# Patient Record
Sex: Female | Born: 1955 | Hispanic: No | Smoking: Never smoker
Health system: Southern US, Community
[De-identification: ages and names within clinical notes are randomized; demographics above are authoritative.]

## PROBLEM LIST (undated history)

## (undated) DIAGNOSIS — I10 Essential (primary) hypertension: Secondary | ICD-10-CM

## (undated) HISTORY — PX: ABDOMINAL HYSTERECTOMY: SHX81

---

## 2019-06-27 ENCOUNTER — Encounter: Payer: Self-pay | Admitting: Emergency Medicine

## 2019-06-27 ENCOUNTER — Other Ambulatory Visit: Payer: Self-pay

## 2019-06-27 ENCOUNTER — Emergency Department: Payer: Self-pay

## 2019-06-27 DIAGNOSIS — I10 Essential (primary) hypertension: Secondary | ICD-10-CM | POA: Insufficient documentation

## 2019-06-27 DIAGNOSIS — R002 Palpitations: Secondary | ICD-10-CM | POA: Insufficient documentation

## 2019-06-27 DIAGNOSIS — R1011 Right upper quadrant pain: Secondary | ICD-10-CM | POA: Insufficient documentation

## 2019-06-27 LAB — CBC WITH DIFFERENTIAL/PLATELET
Abs Immature Granulocytes: 0.01 10*3/uL (ref 0.00–0.07)
Basophils Absolute: 0 10*3/uL (ref 0.0–0.1)
Basophils Relative: 1 %
Eosinophils Absolute: 0.2 10*3/uL (ref 0.0–0.5)
Eosinophils Relative: 3 %
HCT: 35.6 % — ABNORMAL LOW (ref 36.0–46.0)
Hemoglobin: 11.7 g/dL — ABNORMAL LOW (ref 12.0–15.0)
Immature Granulocytes: 0 %
Lymphocytes Relative: 34 %
Lymphs Abs: 1.5 10*3/uL (ref 0.7–4.0)
MCH: 27.6 pg (ref 26.0–34.0)
MCHC: 32.9 g/dL (ref 30.0–36.0)
MCV: 84 fL (ref 80.0–100.0)
Monocytes Absolute: 0.3 10*3/uL (ref 0.1–1.0)
Monocytes Relative: 7 %
Neutro Abs: 2.4 10*3/uL (ref 1.7–7.7)
Neutrophils Relative %: 55 %
Platelets: 195 10*3/uL (ref 150–400)
RBC: 4.24 MIL/uL (ref 3.87–5.11)
RDW: 12.8 % (ref 11.5–15.5)
WBC: 4.4 10*3/uL (ref 4.0–10.5)
nRBC: 0 % (ref 0.0–0.2)

## 2019-06-27 LAB — COMPREHENSIVE METABOLIC PANEL
ALT: 11 U/L (ref 0–44)
AST: 13 U/L — ABNORMAL LOW (ref 15–41)
Albumin: 3.8 g/dL (ref 3.5–5.0)
Alkaline Phosphatase: 67 U/L (ref 38–126)
Anion gap: 7 (ref 5–15)
BUN: 22 mg/dL (ref 8–23)
CO2: 23 mmol/L (ref 22–32)
Calcium: 8.8 mg/dL — ABNORMAL LOW (ref 8.9–10.3)
Chloride: 111 mmol/L (ref 98–111)
Creatinine, Ser: 1.05 mg/dL — ABNORMAL HIGH (ref 0.44–1.00)
GFR calc Af Amer: >60 mL/min (ref >60)
GFR calc non Af Amer: 57 mL/min — ABNORMAL LOW (ref >60)
Glucose, Bld: 119 mg/dL — ABNORMAL HIGH (ref 70–99)
Potassium: 3.8 mmol/L (ref 3.5–5.1)
Sodium: 141 mmol/L (ref 135–145)
Total Bilirubin: 0.5 mg/dL (ref 0.3–1.2)
Total Protein: 7.1 g/dL (ref 6.5–8.1)

## 2019-06-27 LAB — TROPONIN I (HIGH SENSITIVITY): Troponin I (High Sensitivity): 7 ng/L (ref <18)

## 2019-06-27 NOTE — ED Triage Notes (Signed)
Pt to triage via w/c with no distress noted, mask in place; Family reports pt c/o "heart beating rapidly", SHOB since yesterday; no c/o at present, symptoms have been intermittent; denies hx of same

## 2019-06-28 ENCOUNTER — Emergency Department: Payer: Self-pay

## 2019-06-28 ENCOUNTER — Encounter: Payer: Self-pay | Admitting: Radiology

## 2019-06-28 ENCOUNTER — Emergency Department
Admission: EM | Admit: 2019-06-28 | Discharge: 2019-06-28 | Disposition: A | Discharge status: Home / Self Care | Payer: Self-pay | Attending: Emergency Medicine | Admitting: Emergency Medicine

## 2019-06-28 DIAGNOSIS — R1011 Right upper quadrant pain: Secondary | ICD-10-CM

## 2019-06-28 DIAGNOSIS — R002 Palpitations: Secondary | ICD-10-CM

## 2019-06-28 HISTORY — DX: Essential (primary) hypertension: I10

## 2019-06-28 LAB — LIPASE, BLOOD: Lipase: 31 U/L (ref 11–51)

## 2019-06-28 LAB — TROPONIN I (HIGH SENSITIVITY): Troponin I (High Sensitivity): 9 ng/L (ref <18)

## 2019-06-28 MED ORDER — IOHEXOL 350 MG/ML SOLN
100.0000 mL | Freq: Once | INTRAVENOUS | Status: AC | PRN
  Administered 2019-06-28: 100 mL via INTRAVENOUS

## 2019-06-28 MED ORDER — IOHEXOL 350 MG/ML SOLN: 75.0000 mL | Freq: Once | INTRAVENOUS | Status: DC | PRN

## 2019-07-02 NOTE — ED Provider Notes (Signed)
[facility] Emergency Department Provider Note  ____________________________________________   First MD Initiated Contact with Patient 06/28/19 0158     (approximate)  I have reviewed the triage vital signs and the nursing notes.   HISTORY  Chief Complaint Shortness of Breath    HPI Deborah Wiley is a 64 y.o. female with history of hypertension presents to the emergency department secondary to intermittent rapid heartbeat which patient states has been occurring only in the evening between the hours of 4 and 7 PM.  Patient also admits to associated dyspnea and chest pain with a current pain score 7 out of 10.  Patient denies any nausea or vomiting.  Patient denies any abdominal pain.  Patient denies any diarrhea constipation.  Patient denies any fever or cough.  Patient denies any dizziness.  Patient denies any feelings of rapid heartbeat at present or discomfort.  Of note on arrival patient stated sharp chest pain that was 7 out of 10.     Past Medical History:  Diagnosis Date  . Hypertension     There are no problems to display for this patient.   Past Surgical History:  Procedure Laterality Date  . ABDOMINAL HYSTERECTOMY      Prior to Admission medications   Medication Sig Start Date End Date Taking? Authorizing Provider  valsartan-hydrochlorothiazide (DIOVAN-HCT) 80-12.5 MG tablet Take 1 tablet by mouth daily.   Yes [provider]    Allergies Patient has no known allergies.  No family history on file.  Social History Social History   Tobacco Use  . Smoking status: Never Smoker  . Smokeless tobacco: Never Used  Substance Use Topics  . Alcohol use: Not on file  . Drug use: Not on file    Review of Systems Constitutional: No fever/chills Eyes: No visual changes. ENT: No sore throat. Cardiovascular: Positive for chest pain and rapid heartbeat. Respiratory: Positive for shortness of breath. Gastrointestinal: No  abdominal pain.  No nausea, no vomiting.  No diarrhea.  No constipation. Genitourinary: Negative for dysuria. Musculoskeletal: Negative for neck pain.  Negative for back pain. Integumentary: Negative for rash. Neurological: Negative for headaches, focal weakness or numbness.  ____________________________________________   PHYSICAL EXAM:  VITAL SIGNS: ED Triage Vitals  Enc Vitals Group     BP 06/27/19 2209 (!) 151/78     Pulse Rate 06/27/19 2209 (!) 116     Resp 06/27/19 2209 18     Temp 06/27/19 2209 98.5 F (36.9 C)     Temp Source 06/27/19 2209 Oral     SpO2 06/27/19 2209 97 %     Weight 06/27/19 2209 68 kg (150 lb)     Height 06/27/19 2209 1.575 m (5\' 2" )     Head Circumference --      Peak Flow --      Pain Score 06/27/19 2216 0     Pain Loc --      Pain Edu? --      Excl. in GC? --     Constitutional: Alert and oriented.  Eyes: Conjunctivae are normal.  Mouth/Throat: Patient is wearing a mask. Neck: No stridor.  No meningeal signs.   Cardiovascular: Normal rate, regular rhythm. Good peripheral circulation. Grossly normal heart sounds. Respiratory: Normal respiratory effort.  No retractions. Gastrointestinal: Right upper quadrant/epigastric tenderness to palpation.. No distention.  Musculoskeletal: No lower extremity tenderness nor edema. No gross deformities of extremities. Neurologic:  Normal speech and language. No gross focal neurologic deficits are appreciated.  Skin:  Skin is warm, dry and intact. Psychiatric: Mood and affect are normal. Speech and behavior are normal.  ____________________________________________   LABS (all labs ordered are listed, but only abnormal results are displayed)  Labs Reviewed  CBC WITH DIFFERENTIAL/PLATELET - Abnormal; Notable for the following components:      Result Value   Hemoglobin 11.7 (*)    HCT 35.6 (*)    All other components within normal limits  COMPREHENSIVE METABOLIC PANEL - Abnormal; Notable for the following  components:   Glucose, Bld 119 (*)    Creatinine, Ser 1.05 (*)    Calcium 8.8 (*)    AST 13 (*)    GFR calc non Af Amer 57 (*)    All other components within normal limits  LIPASE, BLOOD  TROPONIN I (HIGH SENSITIVITY)  TROPONIN I (HIGH SENSITIVITY)   ____________________________________________  EKG  ED ECG REPORT I, Hun[place]ell N BROWN, the attending physician, personally viewed and interpreted this ECG.   Date: 06/27/2019  EKG Time: 10:14 PM  Rate: 117  Rhythm: Sinus tachycardia  Axis: Normal  Intervals: Normal  ST&T Change: None _________________________________  RADIOLOGY I,  N BROWN, personally viewed and evaluated these images (plain radiographs) as part of my medical decision making, as well as reviewing the written report by the radiologist.    Official radiology report(s): CLINICAL DATA:  64 year old female with shortness of breath and tachycardia. Acute abdominal pain.  EXAM: CT ANGIOGRAPHY CHEST  CT ABDOMEN AND PELVIS WITH CONTRAST  TECHNIQUE: Multidetector CT imaging of the chest was performed using the standard protocol during bolus administration of intravenous contrast. Multiplanar CT image reconstructions and MIPs were obtained to evaluate the vascular anatomy. Multidetector CT imaging of the abdomen and pelvis was performed using the standard protocol during bolus administration of intravenous contrast.  CONTRAST:  OMNIPAQUE IOHEXOL 350 MG/ML SOLN  COMPARISON:  Chest radiographs last night.  FINDINGS: CTA CHEST FINDINGS  Cardiovascular: Good contrast bolus timing in the pulmonary arterial tree. Mild respiratory motion, especially in the right lower lobe.  No focal filling defect identified in the pulmonary arteries to suggest acute pulmonary embolism.  Calcified coronary artery atherosclerosis (series 7, image 137). Mild cardiomegaly. No pericardial effusion. Negative aorta aside from mild atherosclerosis.   Mediastinum/Nodes: Small volume soft tissue in the anterior superior mediastinum most resembles physiologic thymus. No suspicious mediastinal mass or lymphadenopathy.  Lungs/Pleura: Major airways are patent. Lung volumes are within normal limits. There is minimal lung base atelectasis and/or scarring. No pleural effusion or other abnormal pulmonary opacity.  Musculoskeletal: Degenerative changes with mildly exaggerated kyphosis in the midthoracic spine. No acute or suspicious osseous lesion identified.  Review of the MIP images confirms the above findings.  CT ABDOMEN and PELVIS FINDINGS  Hepatobiliary: Evidence of a benign hepatic hemangioma at the posterior liver dome measuring about 2 cm on series 4, image 9. Elsewhere normal liver enhancement. The gallbladder and bile ducts are within normal limits.  Pancreas: Negative.  Spleen: Negative.  Adrenals/Urinary Tract: Normal adrenal glands. Symmetric renal enhancement and contrast excretion with normal proximal ureters. No definite nephrolithiasis. Unremarkable urinary bladder.  Stomach/Bowel: Largely decompressed and negative colon from the splenic flexure distally. Mild retained stool. Similar mostly decompressed and negative appearing transverse and right colon. The terminal ileum is fluid-filled but otherwise appears normal. Appendix not identified. No pericecal inflammation.  Fluid-filled but nondilated small bowel elsewhere in the abdomen and pelvis. Mildly distended stomach. Negative duodenum. No free air, free fluid, or mesenteric inflammation identified.  Vascular/Lymphatic:  Mild Calcified aortic atherosclerosis. Major arterial structures are patent. Portal venous system is patent. No lymphadenopathy.  Reproductive: Surgically absent uterus and diminutive or absent ovaries.  Other: No pelvic free fluid.  Musculoskeletal: Moderate dextroconvex lumbar scoliosis with disc, endplate, and facet  degeneration. No acute or suspicious osseous lesion identified.  Review of the MIP images confirms the above findings.  IMPRESSION: 1. Negative for acute pulmonary embolus. 2. No acute or inflammatory process identified in the chest, abdomen, or pelvis. 3. Coronary artery and mild aortic atherosclerosis. 4. Chronic spine degeneration.  Lumbar scoliosis. 5. Benign 2 cm liver hemangioma.   Electronically Signed   By: Genevie Ann M.D.   On: 06/28/2019 04:27   Procedures   ____________________________________________   INITIAL IMPRESSION / MDM / [place] / ED COURSE  As part of my medical decision making, I reviewed the following data within the electronic MEDICAL RECORD NUMBER  64 year old female presented with above-stated history and physical exam differential diagnosis including but not limited to arrhythmia, ACS, PE, intra-abdominal pathology including cholelithiasis cholecystitis diverticulitis and aortic disease.  Laboratory data revealed no acute findings including high-sensitivity troponin negative x2 CT PE protocol and chest abdomen pelvis also revealed no acute intra-abdominal or thoracic findings.  Patient referred to cardiology for further outpatient evaluation including  Holter monitor.  ____________________________________________  FINAL CLINICAL IMPRESSION(S) / ED DIAGNOSES  Final diagnoses:  RUQ pain  Heart palpitations     MEDICATIONS GIVEN DURING THIS VISIT:  Medications  iohexol (OMNIPAQUE) 350 MG/ML injection 100 mL (100 mLs Intravenous Contrast Given 06/28/19 0341)     ED Discharge Orders    None      *Please note:  Kristiana Jacko was evaluated in Emergency Department on 07/02/2019 for the symptoms described in the history of present illness. She was evaluated in the context of the global COVID-19 pandemic, which necessitated consideration that the patient might be at risk for infection with the SARS-CoV-2 virus that causes COVID-19.  Institutional protocols and algorithms that pertain to the evaluation of patients at risk for COVID-19 are in a state of rapid change based on information released by regulatory bodies including the CDC and federal and state organizations. These policies and algorithms were followed during the patient's care in the ED.  Some ED evaluations and interventions may be delayed as a result of limited staffing during the pandemic.*  Note:  This document was prepared using Dragon voice recognition software and may include unintentional dictation errors.   Gregor Hams, MD 07/02/19 2249

## 2019-07-06 ENCOUNTER — Ambulatory Visit: Payer: Self-pay | Admitting: Family Medicine

## 2019-07-06 ENCOUNTER — Other Ambulatory Visit: Payer: Self-pay

## 2019-07-06 DIAGNOSIS — I10 Essential (primary) hypertension: Secondary | ICD-10-CM

## 2019-07-06 DIAGNOSIS — G8929 Other chronic pain: Secondary | ICD-10-CM

## 2019-07-06 DIAGNOSIS — M545 Low back pain: Secondary | ICD-10-CM

## 2019-07-06 MED ORDER — LIDOCAINE 5 % EX PTCH: 1.0000 | MEDICATED_PATCH | CUTANEOUS | 5 refills | Status: AC

## 2019-07-06 NOTE — Progress Notes (Signed)
S: complain of lower back pain for 1 year getting worse Wants to see a back specialist  O: Sounds good  Not in acute distress Normal respiratory effort  A/P Chronic bilateral low back pain without sciatica - Plan: Ambulatory referral to Orthopedic Surgery  Essential hypertension  Requested Prescriptions   Signed Prescriptions Disp Refills  . lidocaine (LIDODERM) 5 % 30 patch 5    Sig: Place 1 patch onto the skin daily. Remove & Discard patch within 12 hours or as directed by MD

## 2019-07-20 ENCOUNTER — Ambulatory Visit: Payer: Self-pay | Admitting: Orthopedic Surgery

## 2019-08-10 ENCOUNTER — Ambulatory Visit: Payer: Self-pay | Admitting: Orthopedic Surgery

## 2019-10-07 ENCOUNTER — Other Ambulatory Visit: Payer: Self-pay

## 2019-10-07 DIAGNOSIS — Z20822 Contact with and (suspected) exposure to covid-19: Secondary | ICD-10-CM

## 2019-10-08 LAB — SARS-COV-2, NAA 2 DAY TAT

## 2019-10-08 LAB — NOVEL CORONAVIRUS, NAA: SARS-CoV-2, NAA: NOT DETECTED

## 2021-01-29 IMAGING — US US ABDOMEN LIMITED
1 series · 14 of 25 positions shown · non-contrast
Comparison: CT scan 06/28/2019

CLINICAL DATA: Right upper quadrant abdominal pain.

EXAM:
ULTRASOUND ABDOMEN LIMITED RIGHT UPPER QUADRANT

[Series 1: us abdomen limited ruq · 14 of 49 slices shown]
[im 1/49]
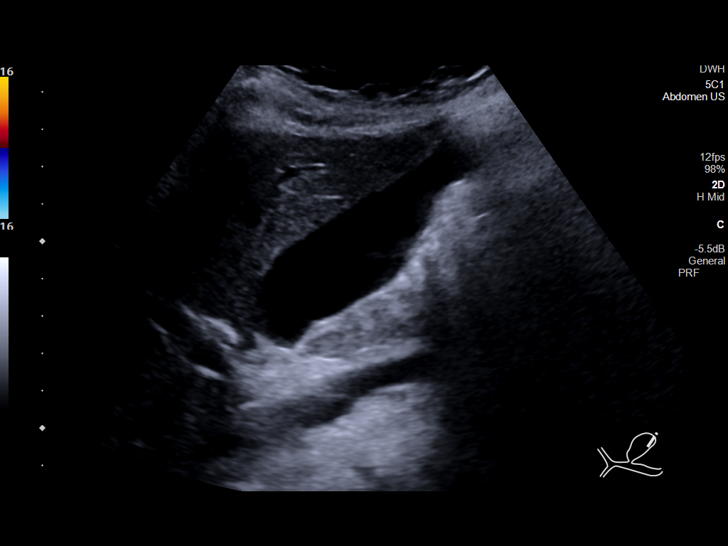
[im 5/49]
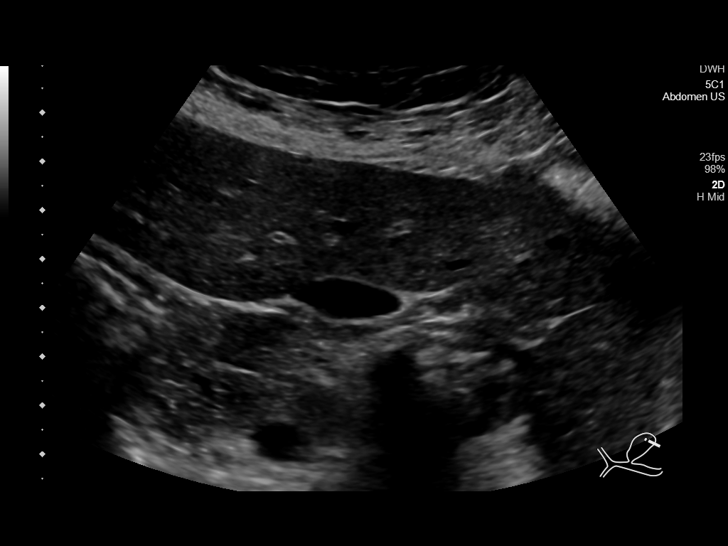
[im 9/49]
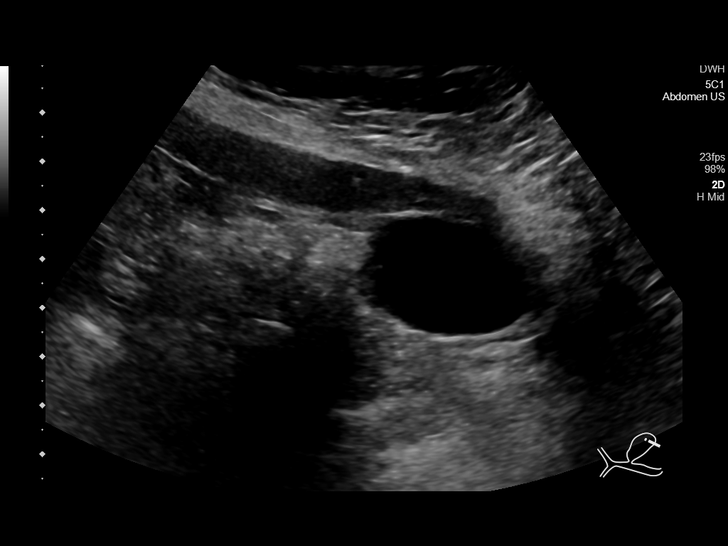
[im 13/49]
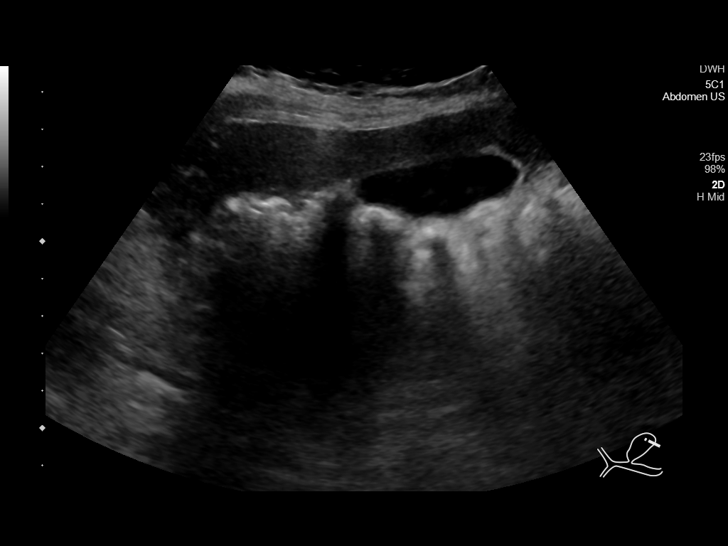
[im 17/49]
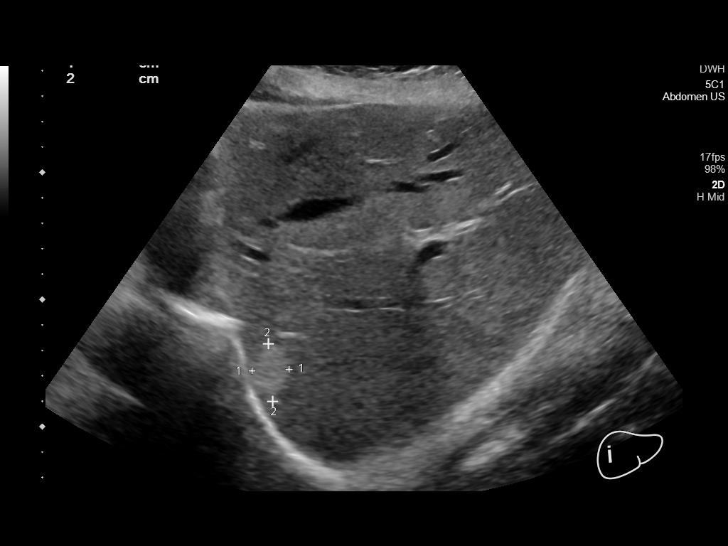
[im 19/49]
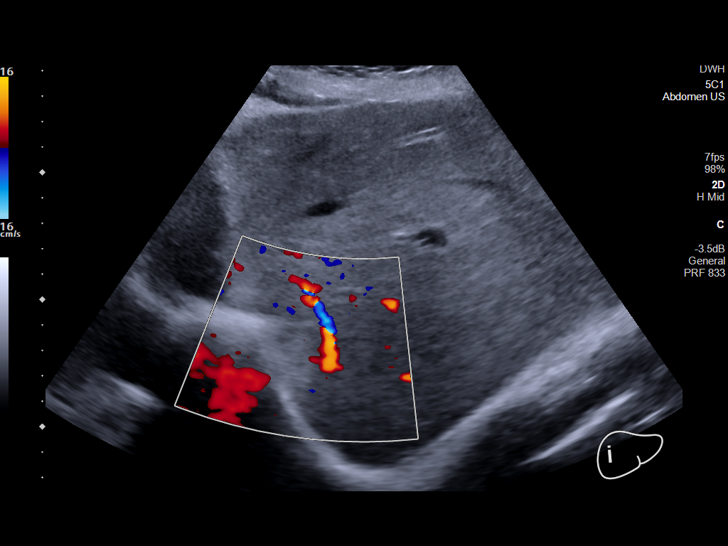
[im 23/49]
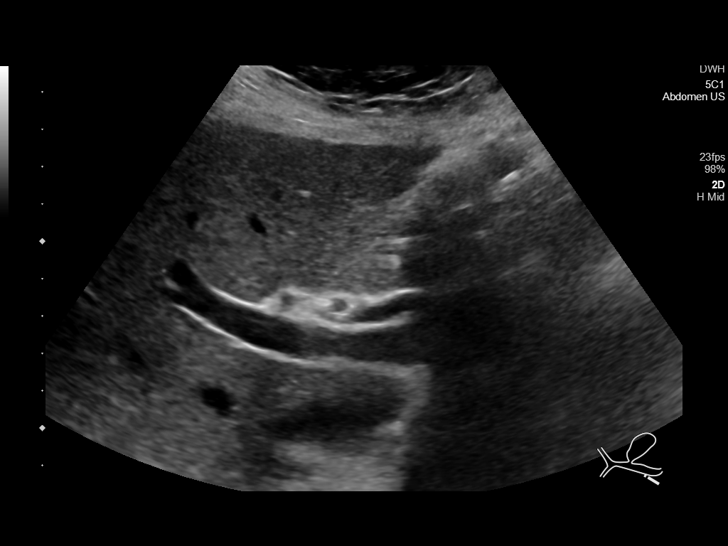
[im 27/49]
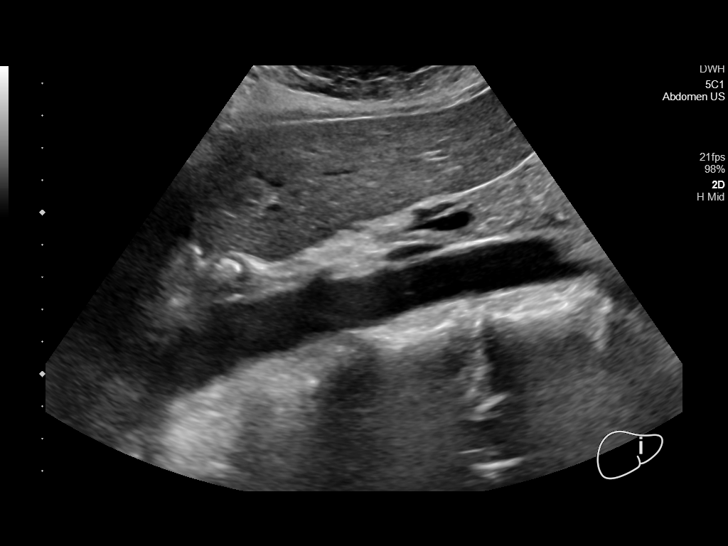
[im 31/49]
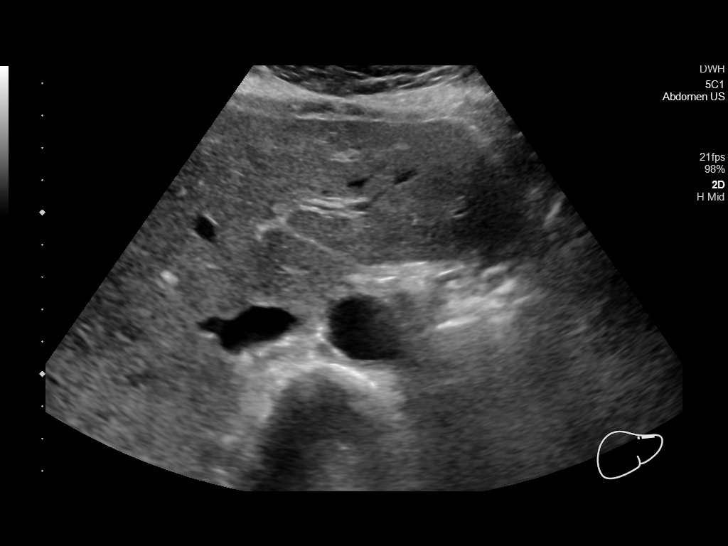
[im 33/49]
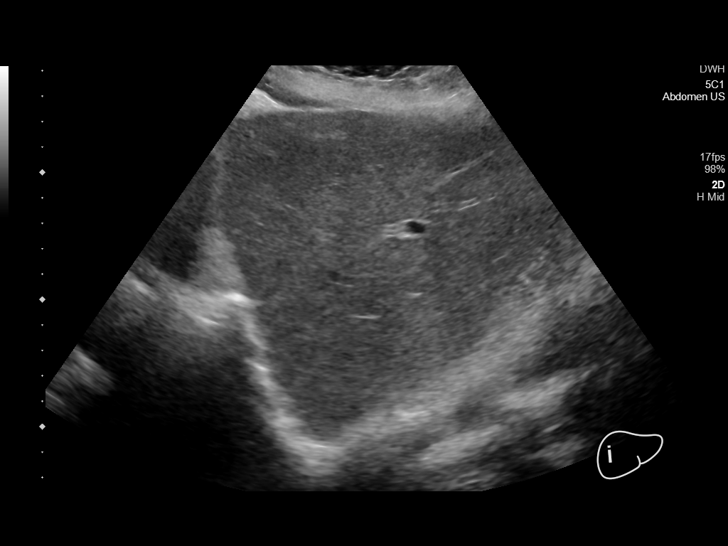
[im 37/49]
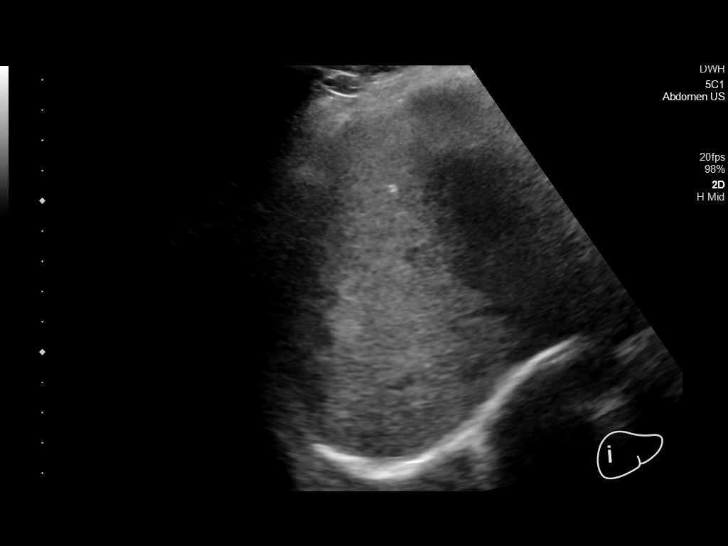
[im 41/49]
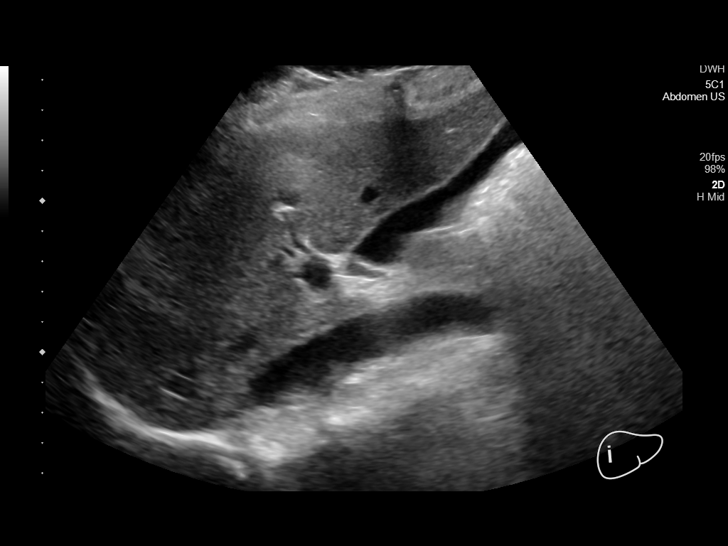
[im 45/49]
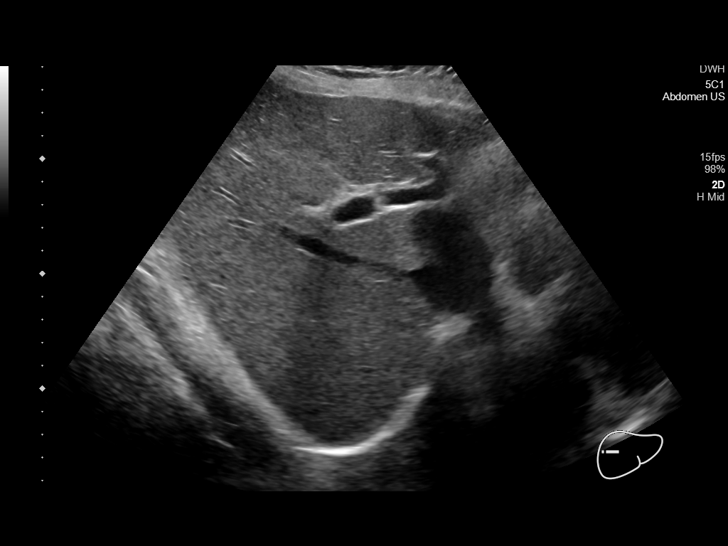
[im 49/49]
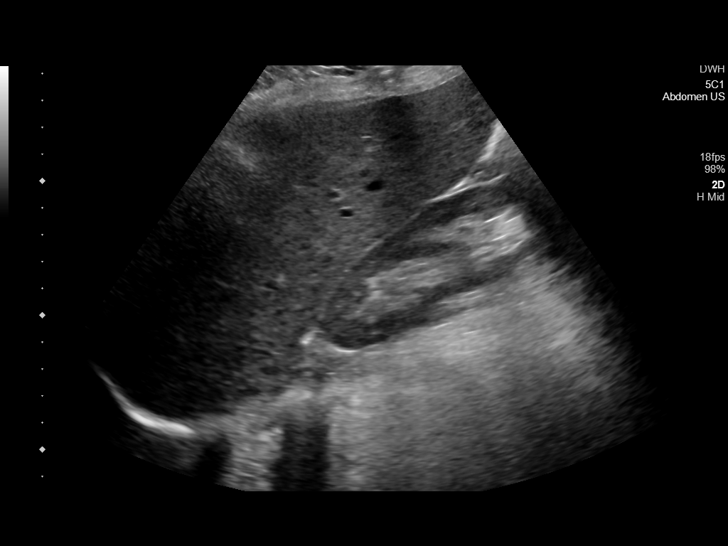

[14 of 25 positions shown; findings below may reference images not displayed]

FINDINGS: Gallbladder:

No gallstones or wall thickening visualized. No sonographic Murphy
sign noted by sonographer.

Common bile duct:

Diameter: 3.8 mm

Liver:

There are 2 small rounded echogenic lesions in the liver posterior
right lobe lesion correlating with a segment 7 lesion seen on the CT
scan measures 1.7 x 1.5 x 2.3 cm. The smaller lesion more laterally
and inferiorly in segment 7 measures 1.2 x 1.1 x 1.2 cm. These are
most consistent with benign hepatic hemangiomas. I would recommend a
follow-up right upper quadrant ultrasound examination in 6 months to
document stability. Portal vein is patent on color Doppler imaging
with normal direction of blood flow towards the liver.

Other: None.
IMPRESSION: 1. Normal gallbladder and normal caliber common bile duct.
2. Two small rounded echogenic liver lesions correlating with CT
scan and most consistent with benign hemangiomas. A follow-up
ultrasound examination in 6 months is suggested to document
stability.

## 2021-01-29 IMAGING — CT CT ABD-PELV W/ CM
2 of 5 series · 14 of 46 positions shown, 16 images · IV contrast (APPLIED)
Comparison: Chest radiographs last night.

CLINICAL DATA: 63-year-old female with shortness of breath and
tachycardia. Acute abdominal pain.

EXAM:
CT ANGIOGRAPHY CHEST
CT ABDOMEN AND PELVIS WITH CONTRAST
TECHNIQUE: Multidetector CT imaging of the chest was performed using the
standard protocol during bolus administration of intravenous
contrast. Multiplanar CT image reconstructions and MIPs were
obtained to evaluate the vascular anatomy. Multidetector CT imaging
of the abdomen and pelvis was performed using the standard protocol
during bolus administration of intravenous contrast.
CONTRAST:  100mL OMNIPAQUE IOHEXOL 350 MG/ML SOLN

[Series 4: routine abd/pel with · axial · 0.65mm/px · z∈[-937,-522]mm · 11 of 93 slices shown, 13 images]
[im 5/93  soft-tissue]
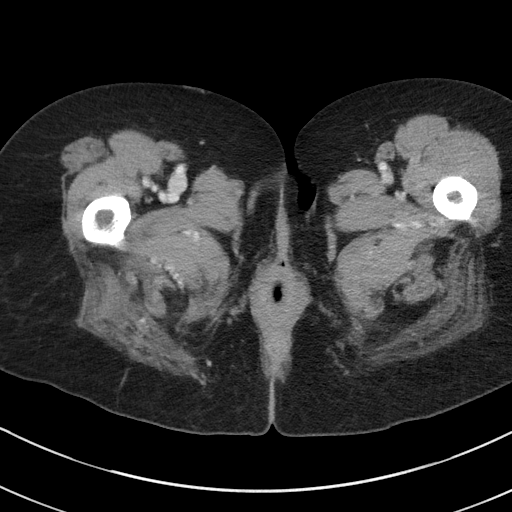
[im 5/93  bone]
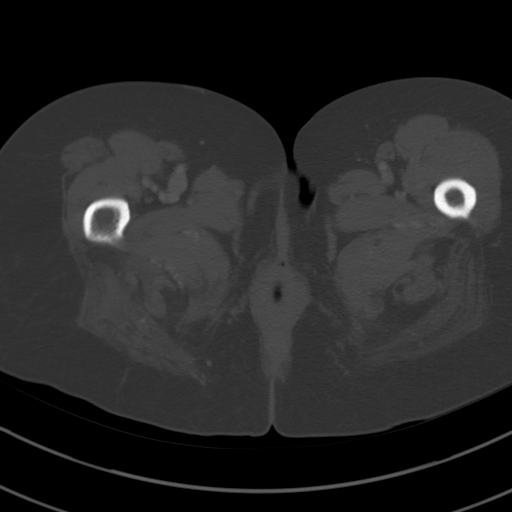
[im 15/93  soft-tissue]
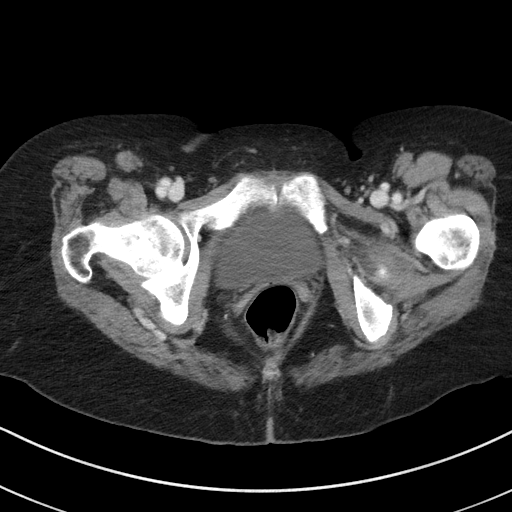
[im 25/93  soft-tissue]
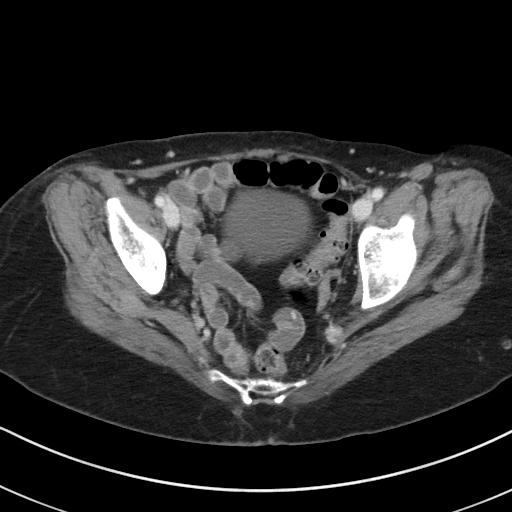
[im 30/93  soft-tissue]
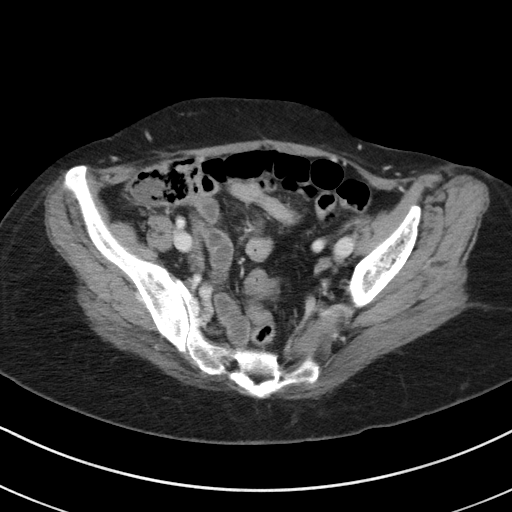
[im 39/93  soft-tissue]
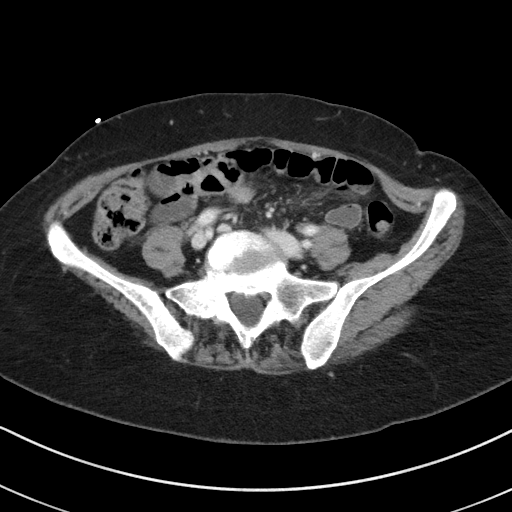
[im 49/93  soft-tissue]
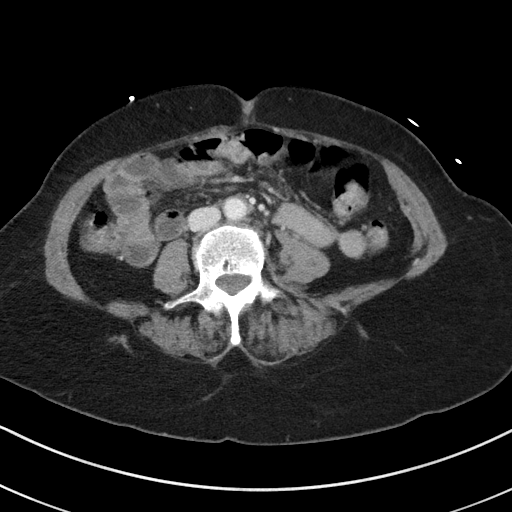
[im 54/93  soft-tissue]
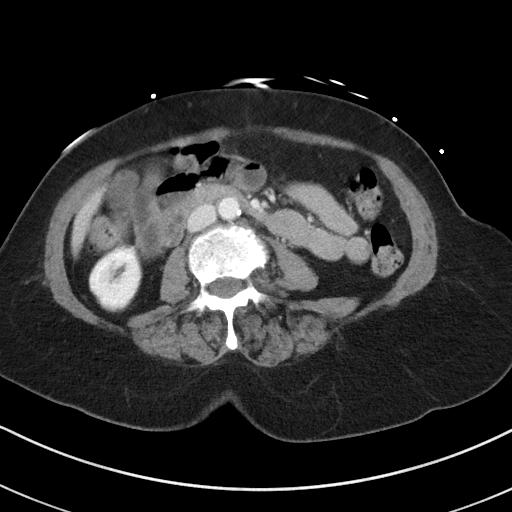
[im 63/93  soft-tissue]
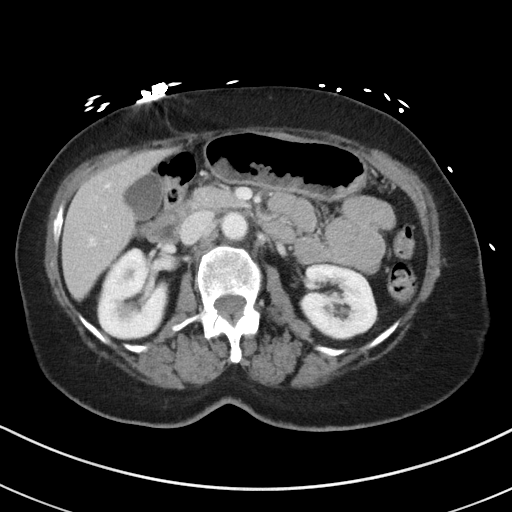
[im 68/93  soft-tissue]
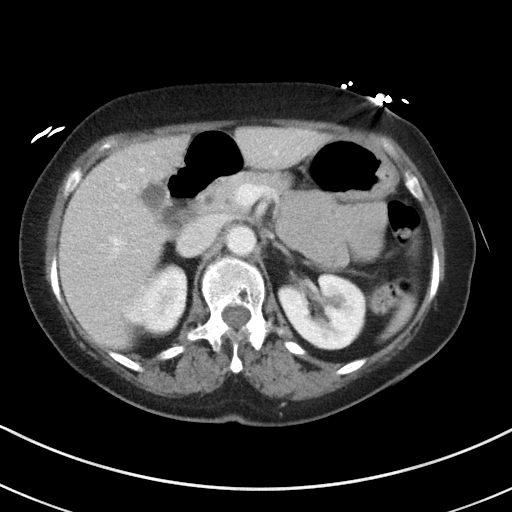
[im 68/93  bone]
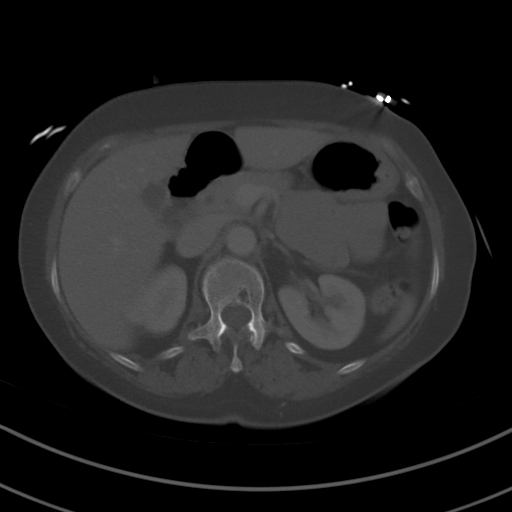
[im 78/93  soft-tissue]
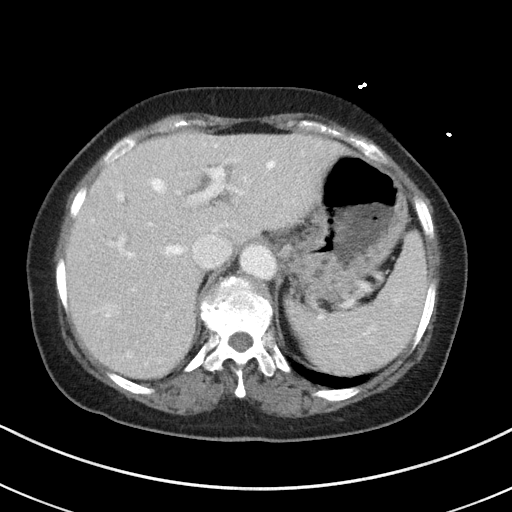
[im 88/93  soft-tissue]
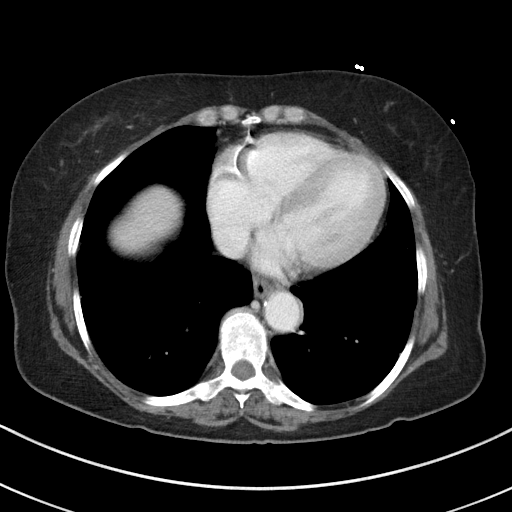

[Series 10: coronal st · coronal · 0.69mm/px · 3 of 85 slices shown]
[im 29/85  soft-tissue]
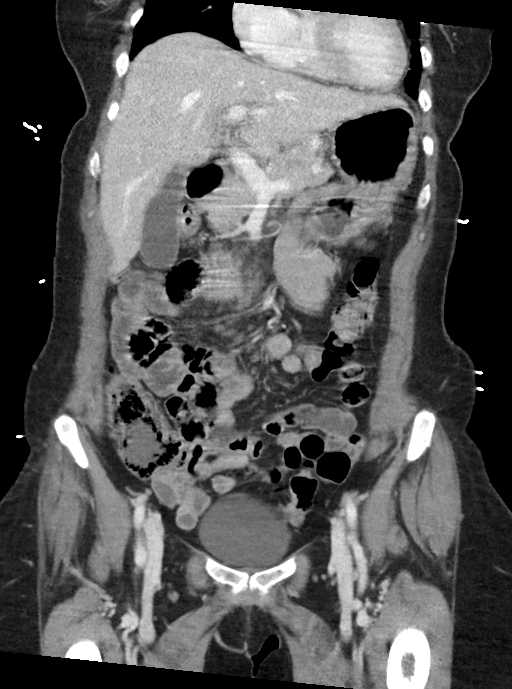
[im 38/85  soft-tissue]
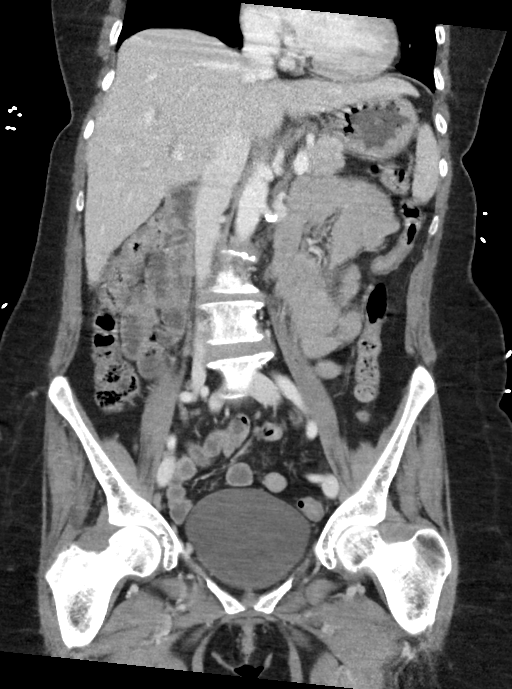
[im 47/85  soft-tissue]
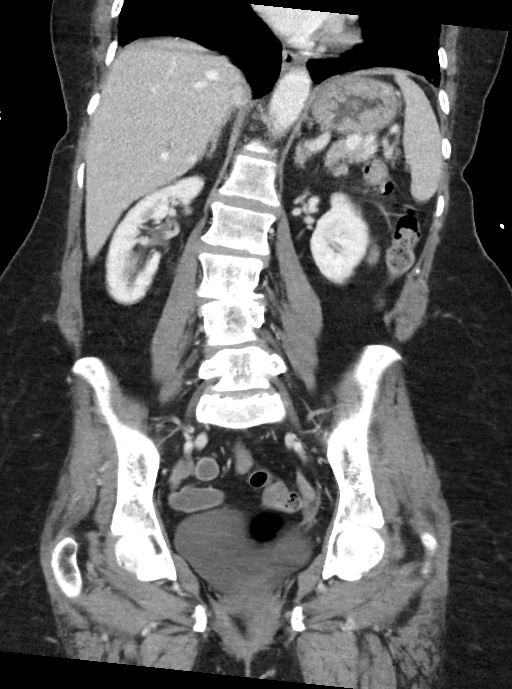

[14 of 46 positions shown; findings below may reference images not displayed]

FINDINGS: CTA CHEST FINDINGS

Cardiovascular: Good contrast bolus timing in the pulmonary arterial
tree. Mild respiratory motion, especially in the right lower lobe.

No focal filling defect identified in the pulmonary arteries to
suggest acute pulmonary embolism.

Calcified coronary artery atherosclerosis (series 7, image 137).
Mild cardiomegaly. No pericardial effusion. Negative aorta aside
from mild atherosclerosis.

Mediastinum/Nodes: Small volume soft tissue in the anterior superior
mediastinum most resembles physiologic thymus. No suspicious
mediastinal mass or lymphadenopathy.

Lungs/Pleura: Major airways are patent. Lung volumes are within
normal limits. There is minimal lung base atelectasis and/or
scarring. No pleural effusion or other abnormal pulmonary opacity.

Musculoskeletal: Degenerative changes with mildly exaggerated
kyphosis in the midthoracic spine. No acute or suspicious osseous
lesion identified.

Review of the MIP images confirms the above findings.

CT ABDOMEN and PELVIS FINDINGS

Hepatobiliary: Evidence of a benign hepatic hemangioma at the
posterior liver dome measuring about 2 cm on series 4, image 9.
Elsewhere normal liver enhancement. The gallbladder and bile ducts
are within normal limits.

Pancreas: Negative.

Spleen: Negative.

Adrenals/Urinary Tract: Normal adrenal glands. Symmetric renal
enhancement and contrast excretion with normal proximal ureters. No
definite nephrolithiasis. Unremarkable urinary bladder.

Stomach/Bowel: Largely decompressed and negative colon from the
splenic flexure distally. Mild retained stool. Similar mostly
decompressed and negative appearing transverse and right colon. The
terminal ileum is fluid-filled but otherwise appears normal.
Appendix not identified. No pericecal inflammation.

Fluid-filled but nondilated small bowel elsewhere in the abdomen and
pelvis. Mildly distended stomach. Negative duodenum. No free air,
free fluid, or mesenteric inflammation identified.

Vascular/Lymphatic: Mild Calcified aortic atherosclerosis. Major
arterial structures are patent. Portal venous system is patent. No
lymphadenopathy.

Reproductive: Surgically absent uterus and diminutive or absent
ovaries.

Other: No pelvic free fluid.

Musculoskeletal: Moderate dextroconvex lumbar scoliosis with disc,
endplate, and facet degeneration. No acute or suspicious osseous
lesion identified.

Review of the MIP images confirms the above findings.
IMPRESSION: 1. Negative for acute pulmonary embolus.
2. No acute or inflammatory process identified in the chest,
abdomen, or pelvis.
3. Coronary artery and mild aortic atherosclerosis.
4. Chronic spine degeneration.  Lumbar scoliosis.
5. Benign 2 cm liver hemangioma.
